# Patient Record
Sex: Male | Born: 2000 | Race: Black or African American | Hispanic: No | Marital: Single
Health system: Southern US, Community
[De-identification: ages and names within clinical notes are randomized; demographics above are authoritative.]

---
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
## 2020-09-09 ENCOUNTER — Encounter: Payer: Self-pay | Admitting: Emergency Medicine

## 2020-09-09 ENCOUNTER — Ambulatory Visit
Admission: EM | Admit: 2020-09-09 | Discharge: 2020-09-09 | Disposition: A | Discharge status: Other Acute Inpatient Hospital | Payer: Managed Care, Other (non HMO)

## 2020-09-09 ENCOUNTER — Emergency Department: Payer: Managed Care, Other (non HMO)

## 2020-09-09 ENCOUNTER — Other Ambulatory Visit: Payer: Self-pay

## 2020-09-09 ENCOUNTER — Emergency Department
Admission: EM | Admit: 2020-09-09 | Discharge: 2020-09-09 | Disposition: A | Discharge status: Home / Self Care | Payer: Managed Care, Other (non HMO) | Attending: Emergency Medicine | Admitting: Emergency Medicine

## 2020-09-09 DIAGNOSIS — R200 Anesthesia of skin: Secondary | ICD-10-CM | POA: Insufficient documentation

## 2020-09-09 DIAGNOSIS — Z7722 Contact with and (suspected) exposure to environmental tobacco smoke (acute) (chronic): Secondary | ICD-10-CM | POA: Insufficient documentation

## 2020-09-09 DIAGNOSIS — R531 Weakness: Secondary | ICD-10-CM | POA: Insufficient documentation

## 2020-09-09 DIAGNOSIS — R29898 Other symptoms and signs involving the musculoskeletal system: Secondary | ICD-10-CM

## 2020-09-09 LAB — CBC
HCT: 43.3 % (ref 39.0–52.0)
Hemoglobin: 14.1 g/dL (ref 13.0–17.0)
MCH: 26.1 pg (ref 26.0–34.0)
MCHC: 32.6 g/dL (ref 30.0–36.0)
MCV: 80 fL (ref 80.0–100.0)
Platelets: 256 10*3/uL (ref 150–400)
RBC: 5.41 MIL/uL (ref 4.22–5.81)
RDW: 12.9 % (ref 11.5–15.5)
WBC: 6.5 10*3/uL (ref 4.0–10.5)
nRBC: 0 % (ref 0.0–0.2)

## 2020-09-09 LAB — COMPREHENSIVE METABOLIC PANEL
ALT: 13 U/L (ref 0–44)
AST: 17 U/L (ref 15–41)
Albumin: 4.5 g/dL (ref 3.5–5.0)
Alkaline Phosphatase: 114 U/L (ref 38–126)
Anion gap: 8 (ref 5–15)
BUN: 17 mg/dL (ref 6–20)
CO2: 27 mmol/L (ref 22–32)
Calcium: 9.7 mg/dL (ref 8.9–10.3)
Chloride: 103 mmol/L (ref 98–111)
Creatinine, Ser: 1.01 mg/dL (ref 0.61–1.24)
GFR, Estimated: >60 mL/min (ref >60)
Glucose, Bld: 91 mg/dL (ref 70–99)
Potassium: 3.9 mmol/L (ref 3.5–5.1)
Sodium: 138 mmol/L (ref 135–145)
Total Bilirubin: 0.8 mg/dL (ref 0.3–1.2)
Total Protein: 7.8 g/dL (ref 6.5–8.1)

## 2020-09-09 LAB — DIFFERENTIAL
Abs Immature Granulocytes: 0.02 10*3/uL (ref 0.00–0.07)
Basophils Absolute: 0 10*3/uL (ref 0.0–0.1)
Basophils Relative: 1 %
Eosinophils Absolute: 0.1 10*3/uL (ref 0.0–0.5)
Eosinophils Relative: 2 %
Immature Granulocytes: 0 %
Lymphocytes Relative: 24 %
Lymphs Abs: 1.5 10*3/uL (ref 0.7–4.0)
Monocytes Absolute: 0.3 10*3/uL (ref 0.1–1.0)
Monocytes Relative: 5 %
Neutro Abs: 4.5 10*3/uL (ref 1.7–7.7)
Neutrophils Relative %: 68 %

## 2020-09-09 LAB — PROTIME-INR
INR: 1.1 (ref 0.8–1.2)
Prothrombin Time: 13.7 seconds (ref 11.4–15.2)

## 2020-09-09 LAB — APTT: aPTT: 32 seconds (ref 24–36)

## 2020-09-09 NOTE — ED Provider Notes (Signed)
[facility] Emergency Department Provider Note ____________________________________________   Event Date/Time   First MD Initiated Contact with Patient 09/09/20 1740     (approximate)  I have reviewed the triage vital signs and the nursing notes.  HISTORY  Chief Complaint Extremity Weakness   HPI Randy Turner is a 20 y.o. malewho presents to the ED for evaluation of right-sided weakness and numbness.  Chart review indicates no relevant history.  Patient is otherwise healthy without known medical conditions.  Takes no prescription medications.  No recent illnesses.  No family history of early stroke or cardiac disease.  Patient presents with his mother, who is a physician, for evaluation of right-sided weakness and paresthesias, primarily to his right leg, and his right arm to a lesser extent.  Patient reports feeling fine yesterday and this morning, taking a nap this afternoon at his grandmother's house, and awakening at about 245 or 3 PM with his symptoms.  He describes a flaccid right leg where he has difficulty feeling or moving it at all.  He reports difficulty walking due to this, denies any falls, trauma or injuries to his back.  Denies back pain, headache, syncopal episodes or injury.  Further describes similar symptoms, torso degree, to his right arm.  Denies symptoms in the left and indicates that this is never happened before.  He reports the symptoms lasted about 2 hours before self resolving reports feeling fine when I see him in the ED.  Of note, he does report sleeping on his right side during his nap today  History reviewed. No pertinent past medical history.  There are no problems to display for this patient.   History reviewed. No pertinent surgical history.  Prior to Admission medications   Not on File    Allergies Patient has no known allergies.  Family History  Problem Relation Age of Onset  . Hypertension Father      Social History Social History   Tobacco Use  . Smoking status: Passive Smoke Exposure - Never Smoker  . Smokeless tobacco: Never Used  Vaping Use  . Vaping Use: Never used  Substance Use Topics  . Alcohol use: No  . Drug use: No    Review of Systems  Constitutional: No fever/chills Eyes: No visual changes. ENT: No sore throat. Cardiovascular: Denies chest pain. Respiratory: Denies shortness of breath. Gastrointestinal: No abdominal pain.  No nausea, no vomiting.  No diarrhea.  No constipation. Genitourinary: Negative for dysuria. Musculoskeletal: Negative for back pain. Skin: Negative for rash. Neurological: Negative for headaches.  Positive for right arm and leg weakness and numbness  ____________________________________________   PHYSICAL EXAM:  VITAL SIGNS: Vitals:   09/09/20 1717 09/09/20 1843  BP: 130/74 115/68  Pulse: (!) 50 (!) 46  Resp: 18 16  Temp: 97.9 F (36.6 C)   SpO2: 100% 100%     Constitutional: Alert and oriented. Well appearing and in no acute distress.  Pleasant and conversational in full sentences. Springs up from the bed without difficulty and independently.  Ambulates around the room in no acute distress with a normal gait. Eyes: Conjunctivae are normal. PERRL. EOMI. Head: Atraumatic. Nose: No congestion/rhinnorhea. Mouth/Throat: Mucous membranes are moist.  Oropharynx non-erythematous. Neck: No stridor. No cervical spine tenderness to palpation. Cardiovascular: Normal rate, regular rhythm. Grossly normal heart sounds.  Good peripheral circulation. Respiratory: Normal respiratory effort.  No retractions. Lungs CTAB. Gastrointestinal: Soft , nondistended, nontender to palpation. No CVA tenderness. Musculoskeletal: No lower extremity tenderness nor edema.  No joint effusions. No signs of acute trauma. Neurologic:  Normal speech and language. No gross focal neurologic deficits are appreciated. No gait instability noted. Cranial nerves II  through XII intact 5/5 strength and sensation in all 4 extremities Skin:  Skin is warm, dry and intact. No rash noted. Psychiatric: Mood and affect are normal. Speech and behavior are normal.  ____________________________________________   LABS (all labs ordered are listed, but only abnormal results are displayed)  Labs Reviewed  PROTIME-INR  APTT  CBC  DIFFERENTIAL  COMPREHENSIVE METABOLIC PANEL   ____________________________________________  12 Lead EKG   ____________________________________________  RADIOLOGY  ED MD interpretation: CT head reviewed by me without evidence of acute pathology.  Official radiology report(s): CT HEAD WO CONTRAST  Result Date: 09/09/2020 CLINICAL DATA:  Right-sided numbness beginning earlier today. No slurred speech. Right leg weakness. EXAM: CT HEAD WITHOUT CONTRAST TECHNIQUE: Contiguous axial images were obtained from the base of the skull through the vertex without intravenous contrast. COMPARISON:  None. FINDINGS: Brain: The brain shows a normal appearance without evidence of malformation, atrophy, old or acute small or large vessel infarction, mass lesion, hemorrhage, hydrocephalus or extra-axial collection. Vascular: No hyperdense vessel. No evidence of atherosclerotic calcification. Skull: Normal.  No traumatic finding.  No focal bone lesion. Sinuses/Orbits: Sinuses are clear. Orbits appear normal. Mastoids are clear. Other: None significant IMPRESSION: Normal head CT Electronically Signed   By: Paulina Fusi M.D.   On: 09/09/2020 17:54    ____________________________________________   PROCEDURES and INTERVENTIONS  Procedure(s) performed (including Critical Care):  .1-3 Lead EKG Interpretation Performed by: Delton Prairie, MD Authorized by: Delton Prairie, MD     Interpretation: normal     ECG rate:  50   ECG rate assessment: bradycardic     Rhythm: sinus bradycardia     Ectopy: none     Conduction: normal      Medications - No  data to display  ____________________________________________   MDM / ED COURSE   Healthy 20 year old male presents to the ED after self resolving right-sided weakness and numbness, possibly due to nerve compression during his nap, and without evidence of central nervous pathology, ultimately amenable to outpatient management.  Normal vitals on room air.  Exam reassuring with a time that I see the patient without evidence of focal neurologic vascular deficits.  Looks well without signs of trauma or injury to the back or neck.  Work-up is benign without evidence of stroke, CNS pathology on CT head.  Blood work is unremarkable without evidence of electrolyte derangements.  On discussion at the bedside with patient and his mother, but the possibility of central nervous pathology being therefore extraordinarily unlikely in this situation.  Shared decision-making yields plan for outpatient management and return precautions to the ED were discussed.     ____________________________________________   FINAL CLINICAL IMPRESSION(S) / ED DIAGNOSES  Final diagnoses:  Transient right leg weakness     ED Discharge Orders    None       Chloeann Alfred   Note:  This document was prepared using Dragon voice recognition software and may include unintentional dictation errors.   Delton Prairie, MD 09/09/20 [phone]

## 2020-09-09 NOTE — ED Provider Notes (Signed)
[facility]    CSN: [id number] Arrival date & time: 09/09/20  1609      History   Chief Complaint Chief Complaint  Patient presents with  . Numbness    Right arm and right leg    HPI Randy Turner is a 20 y.o. male.   HPI   20 year old male brought in by his mother for evaluation of right-sided numbness and weakness this around 2:45 PM today.  I was called to the room to assess the patient.  Patient was assisted from the wheelchair into the stretcher and he is able to answer question but is complaining of right-sided weakness.  He states that he also had some blurry vision and flashing lights in his right eye which have since resolved.  Mom gave him 4 baby aspirin prior to arrival.  History reviewed. No pertinent past medical history.  There are no problems to display for this patient.   History reviewed. No pertinent surgical history.     Home Medications    Prior to Admission medications   Not on File    Family History Family History  Problem Relation Age of Onset  . Hypertension Father     Social History Social History   Tobacco Use  . Smoking status: Passive Smoke Exposure - Never Smoker  . Smokeless tobacco: Never Used  Vaping Use  . Vaping Use: Never used  Substance Use Topics  . Alcohol use: No  . Drug use: No     Allergies   Patient has no known allergies.   Review of Systems Review of Systems  Constitutional: Negative for activity change, appetite change and fever.  Eyes: Positive for visual disturbance.  Neurological: Positive for weakness and numbness. Negative for dizziness and headaches.  Hematological: Negative.   Psychiatric/Behavioral: Negative.      Physical Exam Triage Vital Signs ED Triage Vitals  Enc Vitals Group     BP --      Pulse --      Resp --      Temp --      Temp src --      SpO2 --      Weight 09/09/20 1616 130 lb (59 kg)     Height 09/09/20 1616 5\' 9"  (1.753 m)     Head Circumference --       Peak Flow --      Pain Score 09/09/20 1614 0     Pain Loc --      Pain Edu? --      Excl. in GC? --    No data found.  Updated Vital Signs BP 124/74 (BP Location: Left Arm)   Pulse 68   Temp 98.3 F (36.8 C) (Oral)   Resp 16   Ht 5\' 9"  (1.753 m)   Wt 130 lb (59 kg)   SpO2 100%   BMI 19.20 kg/m   Visual Acuity Right Eye Distance:   Left Eye Distance:   Bilateral Distance:    Right Eye Near:   Left Eye Near:    Bilateral Near:     Physical Exam Vitals and nursing note reviewed.  Constitutional:      Appearance: He is ill-appearing. He is not diaphoretic.  HENT:     Head: Normocephalic and atraumatic.  Eyes:     General: No scleral icterus.    Extraocular Movements: Extraocular movements intact.     Conjunctiva/sclera: Conjunctivae normal.     Pupils: Pupils are equal, round, and  reactive to light.  Cardiovascular:     Rate and Rhythm: Normal rate and regular rhythm.     Pulses: Normal pulses.     Heart sounds: Normal heart sounds. No murmur heard.   Pulmonary:     Effort: Pulmonary effort is normal.     Breath sounds: Normal breath sounds. No wheezing or rales.  Skin:    General: Skin is warm and dry.     Capillary Refill: Capillary refill takes less than 2 seconds.  Neurological:     Mental Status: He is alert.     Cranial Nerves: No cranial nerve deficit.     Sensory: Sensory deficit present.     Motor: Weakness present.     Coordination: Coordination abnormal.     Gait: Gait abnormal.  Psychiatric:        Mood and Affect: Mood normal.        Behavior: Behavior normal.        Thought Content: Thought content normal.        Judgment: Judgment normal.      UC Treatments / Results  Labs (all labs ordered are listed, but only abnormal results are displayed) Labs Reviewed - No data to display  EKG   Radiology No results found.  Procedures Procedures (including critical care time)  Medications Ordered in UC Medications - No data to  display  Initial Impression / Assessment and Plan / UC Course  I have reviewed the triage vital signs and the nursing notes.  Pertinent labs & imaging results that were available during my care of the patient were reviewed by me and considered in my medical decision making (see chart for details).   Patient is a very pleasant 20 year old male who has right-sided weakness and slow speech.  He is able to speak in full sentences but speech is very slow.  Cranial nerves II through XII are grossly intact.  Patient is a benign cardiopulmonary exam.  Upper extremity strength is 3/5 on the right and 5/5 on the left lower extremity strength is 3/5 on the right and 5/5 on the left.  Patient's coordination with his right arm is also impaired.  He is not able to run his right heel down his left shin into corrugated manner.  He is able to run his left tilt on his right shin in a coronary manner.  Patient's proprioception finger-nose is slower and more calculated on the right.  Patient is able to make contact but it takes some more effort.  He is much faster and more coordinated with the left hand.  Concern patient is either having a complex migraine versus neurovascular incident.  I have advised patient and mother that I think he needs to be evaluated in the emergency department and will call EMS to transport.  Report given to paramedic with [facility].  Fingerstick blood sugar is 91.   Final Clinical Impressions(s) / UC Diagnoses   Final diagnoses:  Acute right-sided weakness     Discharge Instructions     Please go to [facility] emergency department for evaluation of your right-sided weakness.    ED Prescriptions    None     PDMP not reviewed this encounter.   Becky Augusta, NP 09/09/20 [phone]

## 2020-09-09 NOTE — Discharge Instructions (Signed)
Return to the ED with any further worsening symptoms or recurrence of strokelike symptoms.

## 2020-09-09 NOTE — ED Triage Notes (Signed)
Pt in via EMS from [facility] UC with c/o right sided numbness that started at 14:45. No slurred speech, grips equal. Pt reports feels like his right leg is weak and was ambulatory on scene. FSBS 91, HR 66, 130/80

## 2020-09-09 NOTE — ED Notes (Signed)
Patients mother is at bedside.

## 2020-09-09 NOTE — ED Notes (Signed)
Patient is being discharged from the Urgent Care and sent to the Emergency Department via EMS . Per Berneta Sages, NP, patient is in need of higher level of care due to possible stroke. Patient is aware and verbalizes understanding of plan of care.  Vitals:   09/09/20 1617  BP: 124/74  Pulse: 68  Resp: 16  Temp: 98.3 F (36.8 C)  SpO2: 100%

## 2020-09-09 NOTE — Discharge Instructions (Addendum)
Please go to [facility] emergency department for evaluation of your right-sided weakness.

## 2020-09-09 NOTE — ED Triage Notes (Signed)
Pt reports weakness in rt arm and right leg that started today at 2:40pm after waking up. Pt states weakness is better now, denies difficulty speaking or slurred speech. Upper and lower extremities are equal in strength, pt is ambulatory. Pt denies injury, denies, N/V/D, dizziness, CP, SOB.

## 2020-09-09 NOTE — ED Triage Notes (Signed)
Mother states that her son texted her around 2:45 pm today that he was having numbness in his right arm and right leg and vision loss in his right eye.

## 2020-09-09 NOTE — ED Triage Notes (Signed)
EMS reports pt mom is a MD and she administered 324mg  of asa to her son incase it was a stroke.
